# Patient Record
Sex: Male | Born: 1985 | Race: White | Hispanic: No | Marital: Married | Smoking: Never smoker
Health system: Southern US, Community
[De-identification: ages and names within clinical notes are randomized; demographics above are authoritative.]

## PROBLEM LIST (undated history)

## (undated) DIAGNOSIS — E7800 Pure hypercholesterolemia, unspecified: Secondary | ICD-10-CM

## (undated) HISTORY — DX: Pure hypercholesterolemia, unspecified: E78.00

---

## 2017-03-08 ENCOUNTER — Other Ambulatory Visit: Payer: Self-pay | Admitting: Otolaryngology

## 2017-03-08 DIAGNOSIS — R42 Dizziness and giddiness: Secondary | ICD-10-CM

## 2017-03-15 ENCOUNTER — Ambulatory Visit
Admission: RE | Admit: 2017-03-15 | Discharge: 2017-03-15 | Disposition: A | Discharge status: Home / Self Care | Payer: BLUE CROSS/BLUE SHIELD | Source: Ambulatory Visit | Attending: Otolaryngology | Admitting: Otolaryngology

## 2017-03-15 ENCOUNTER — Other Ambulatory Visit: Payer: Self-pay | Admitting: Otolaryngology

## 2017-03-15 DIAGNOSIS — Z1389 Encounter for screening for other disorder: Secondary | ICD-10-CM | POA: Diagnosis not present

## 2017-03-15 DIAGNOSIS — Z0189 Encounter for other specified special examinations: Secondary | ICD-10-CM

## 2017-03-15 DIAGNOSIS — R42 Dizziness and giddiness: Secondary | ICD-10-CM | POA: Diagnosis present

## 2017-03-15 MED ORDER — GADOBENATE DIMEGLUMINE 529 MG/ML IV SOLN
20.0000 mL | Freq: Once | INTRAVENOUS | Status: AC | PRN
  Administered 2017-03-15: 20 mL via INTRAVENOUS

## 2018-02-07 ENCOUNTER — Other Ambulatory Visit: Payer: Self-pay | Admitting: Family Medicine

## 2018-02-07 DIAGNOSIS — E559 Vitamin D deficiency, unspecified: Secondary | ICD-10-CM | POA: Insufficient documentation

## 2018-02-07 DIAGNOSIS — R1011 Right upper quadrant pain: Secondary | ICD-10-CM

## 2018-02-07 DIAGNOSIS — R42 Dizziness and giddiness: Secondary | ICD-10-CM | POA: Insufficient documentation

## 2018-02-07 DIAGNOSIS — R0789 Other chest pain: Secondary | ICD-10-CM | POA: Insufficient documentation

## 2018-02-14 ENCOUNTER — Ambulatory Visit
Admission: RE | Admit: 2018-02-14 | Discharge: 2018-02-14 | Disposition: A | Discharge status: Home / Self Care | Payer: BLUE CROSS/BLUE SHIELD | Source: Ambulatory Visit | Attending: Family Medicine | Admitting: Family Medicine

## 2018-02-14 DIAGNOSIS — K824 Cholesterolosis of gallbladder: Secondary | ICD-10-CM | POA: Diagnosis not present

## 2018-02-14 DIAGNOSIS — R1011 Right upper quadrant pain: Secondary | ICD-10-CM | POA: Diagnosis present

## 2018-03-05 ENCOUNTER — Other Ambulatory Visit: Payer: Self-pay | Admitting: Family Medicine

## 2018-03-05 DIAGNOSIS — R319 Hematuria, unspecified: Secondary | ICD-10-CM

## 2018-03-17 ENCOUNTER — Ambulatory Visit
Admission: RE | Admit: 2018-03-17 | Discharge: 2018-03-17 | Disposition: A | Discharge status: Home / Self Care | Payer: BLUE CROSS/BLUE SHIELD | Source: Ambulatory Visit | Attending: Family Medicine | Admitting: Family Medicine

## 2018-03-17 DIAGNOSIS — R160 Hepatomegaly, not elsewhere classified: Secondary | ICD-10-CM | POA: Diagnosis not present

## 2018-03-17 DIAGNOSIS — R319 Hematuria, unspecified: Secondary | ICD-10-CM | POA: Insufficient documentation

## 2018-03-28 ENCOUNTER — Ambulatory Visit: Payer: BLUE CROSS/BLUE SHIELD

## 2018-06-11 ENCOUNTER — Ambulatory Visit (INDEPENDENT_AMBULATORY_CARE_PROVIDER_SITE_OTHER): Payer: BLUE CROSS/BLUE SHIELD | Admitting: Urology

## 2018-06-11 ENCOUNTER — Encounter: Payer: Self-pay | Admitting: Urology

## 2018-06-11 VITALS — BP 121/68 | HR 80 | Ht 74.0 in | Wt 249.8 lb

## 2018-06-11 DIAGNOSIS — R3129 Other microscopic hematuria: Secondary | ICD-10-CM

## 2018-06-11 LAB — URINALYSIS, COMPLETE
Bilirubin, UA: NEGATIVE
Glucose, UA: NEGATIVE
KETONES UA: NEGATIVE
Leukocytes, UA: NEGATIVE
NITRITE UA: NEGATIVE
Protein, UA: NEGATIVE
Specific Gravity, UA: 1.01 (ref 1.005–1.030)
UUROB: 0.2 mg/dL (ref 0.2–1.0)
pH, UA: 5.5 (ref 5.0–7.5)

## 2018-06-11 NOTE — Progress Notes (Signed)
06/11/2018 4:21 PM   Danny Romero 1986-02-24 [id number]  Referring provider: Wilford Corner, PA-C [address] [place], [place] [address]  Chief Complaint  Patient presents with  . Hematuria    HPI: Patient is a 32 -year-old Caucasian male who presents today as a referral from [employer], PA-C for microscopic hematuria.    Patient was found to have microscopic hematuria on three occassions (February 14, 2018, February 28, 2018 and May 16, 2018)  with 4-10 RBC's/hpf.  Had CDL urine dip with blood seven years ago and it was rechecked and blood was gone.   He does not have a prior history of recurrent urinary tract infections, nephrolithiasis, trauma to the genitourinary tract, BPH or malignancies of the genitourinary tract.   His mother and paternal uncle have a history of nephrolithiasis, malignancies of the genitourinary tract or hematuria.   Today, he is having symptoms of frequency and nocturia.   incontinence, hesitancy, intermittency, straining to urinate or a weak urinary stream.  Patient denies any gross hematuria, dysuria or suprapubic/flank pain.  Patient denies any fevers, chills, nausea or vomiting.  His UA today is negative.    Non contrast CT in 03/2018 revealed no obstructing renal/ureteral calculi or hydronephrosis.  Slightly prominent vessels upper scrotum. Cannot exclude varicocele.  Enlarged liver spanning over 19.8 cm.  He is not a smoker.  He is not exposed to secondhand smoke.  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   He  has a high BMI.     PMH: Past Medical History:  Diagnosis Date  . High cholesterol     Surgical History: None   Home Medications:  Allergies as of 06/11/2018      Reactions   Erythromycin Other (See Comments)   "eyes roll back in his head"      Medication List        Accurate as of 06/11/18  4:21 PM. Always use your most recent med list.          azelastine 0.1 % nasal spray Commonly known  as:  ASTELIN Place 0.1 sprays into the nose daily.   azelastine 0.1 % nasal spray Commonly known as:  ASTELIN Place 0.1 sprays into both nostrils daily.   fluticasone 50 MCG/ACT nasal spray Commonly known as:  FLONASE Place 50 sprays into the nose daily.   ibuprofen 200 MG tablet Commonly known as:  ADVIL,MOTRIN Take 200 mg by mouth daily.   PROBIOTIC PO Take 1,000 mg by mouth daily.   vitamin B-12 1000 MCG tablet Commonly known as:  CYANOCOBALAMIN Take 1,000 mcg by mouth daily.   VITAMIN D-1000 MAX ST 1000 units tablet Generic drug:  Cholecalciferol Take 5,000 Units by mouth daily.       Allergies:  Allergies  Allergen Reactions  . Erythromycin Other (See Comments)    "eyes roll back in his head"    Family History: No family history on file.  Social History:  reports that he has never smoked. He has never used smokeless tobacco. He reports that he drinks about 1.0 standard drinks of alcohol per week. He reports that he does not use drugs.  ROS: UROLOGY Frequent Urination?: Yes Hard to postpone urination?: No Burning/pain with urination?: No Get up at night to urinate?: Yes Leakage of urine?: No Urine stream starts and stops?: No Trouble starting stream?: No Do you have to strain to urinate?: No Blood in urine?: Yes Urinary tract infection?: No Sexually transmitted  disease?: No Injury to kidneys or bladder?: No Painful intercourse?: No Weak stream?: No Erection problems?: No Penile pain?: No  Gastrointestinal Nausea?: No Vomiting?: No Indigestion/heartburn?: Yes Diarrhea?: No Constipation?: No  Constitutional Fever: No Night sweats?: Yes Weight loss?: No Fatigue?: No  Skin Skin rash/lesions?: No Itching?: No  Eyes Blurred vision?: No Double vision?: No  Ears/Nose/Throat Sore throat?: Yes Sinus problems?: Yes  Hematologic/Lymphatic Swollen glands?: No Easy bruising?: No  Cardiovascular Leg swelling?: No Chest pain?:  No  Respiratory Cough?: Yes Shortness of breath?: No  Endocrine Excessive thirst?: No  Musculoskeletal Back pain?: No Joint pain?: No  Neurological Headaches?: Yes Dizziness?: Yes  Psychologic Depression?: No Anxiety?: No  Physical Exam: BP 121/68 (BP Location: Right Arm, Patient Position: Sitting, Cuff Size: Large)   Pulse 80   Ht 6\' 2"  (1.88 m)   Wt 249 lb 12.8 oz (113.3 kg)   BMI 32.07 kg/m   Constitutional:  Well nourished. Alert and oriented, No acute distress. HEENT: [place] AT, moist mucus membranes.  Trachea midline, no masses. Cardiovascular: No clubbing, cyanosis, or edema. Respiratory: Normal respiratory effort, no increased work of breathing. GI: Abdomen is soft, non tender, non distended, no abdominal masses. Liver and spleen not palpable.  No hernias appreciated.  Stool sample for occult testing is not indicated.   GU: No CVA tenderness.  No bladder fullness or masses.  Patient with circumcised phallus.  Urethral meatus is patent.  No penile discharge. No penile lesions or rashes. Scrotum without cysts, rashes and/or edema.  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  Left varicocele is noted.  testicles are located scrotally bilaterally. No masses are appreciated in the testicles. Left and right epididymis are normal.  Rectal: Patient with  normal sphincter tone. Anus and perineum without scarring or rashes. No rectal masses are appreciated. Prostate is approximately 35 grams, no nodules are appreciated. Seminal vesicles are normal. Skin: No rashes, bruises or suspicious lesions. Lymph: No cervical or inguinal adenopathy. Neurologic: Grossly intact, no focal deficits, moving all 4 extremities. Psychiatric: Normal mood and affect.  Laboratory Data: No results found for: WBC, HGB, HCT, MCV, PLT  No results found for: CREATININE  No results found for: PSA  No results found for: TESTOSTERONE  No results found for: HGBA1C  No results found for:  TSH  No results found for: CHOL, HDL, CHOLHDL, VLDL, LDLCALC  No results found for: AST No results found for: ALT No components found for: ALKALINEPHOPHATASE No components found for: BILIRUBINTOTAL  No results found for: ESTRADIOL   Urinalysis Negative.  See Epic.  I have reviewed the labs.  Pertinent Imaging: CLINICAL DATA:  32 year old male. Dizzy for the past year. Microhematuria. Initial encounter.  EXAM: CT ABDOMEN AND PELVIS WITHOUT CONTRAST  TECHNIQUE: Multidetector CT imaging of the abdomen and pelvis was performed following the standard protocol without IV contrast.  COMPARISON:  02/14/2018 ultrasound right upper quadrant.  FINDINGS: Lower chest: Minimal scarring/atelectasis lung bases. Heart size within normal limits.  Hepatobiliary: Enlarged liver spanning over 19.8 cm. Taking into account limitation by non contrast imaging, no worrisome hepatic lesion. No calcified gallstone.  Pancreas: Taking into account limitation by non contrast imaging, no worrisome pancreatic mass or inflammation.  Spleen: Taking into account limitation by non contrast imaging, no splenic mass or enlargement.  Adrenals/Urinary Tract: No obstructing stone or hydronephrosis. Taking into account limitation by non contrast imaging, no worrisome renal or adrenal mass. Noncontrast filled views the urinary bladder without gross abnormality.  Stomach/Bowel:  No extraluminal bowel inflammatory process. Specifically, no inflammation surrounds the appendix.  Vascular/Lymphatic: No abdominal aortic aneurysm. Scattered normal size lymph nodes.  Prominent vessels upper aspect of the scrotum. Cannot exclude varicocele.  Other: No free air or bowel containing hernia.  Musculoskeletal: Mild degenerative changes throughout the lower thoracic and lumbar spine. No worrisome osseous lesion.  IMPRESSION: 1. No obstructing renal/ureteral calculi or hydronephrosis. 2. Slightly  prominent vessels upper scrotum. Cannot exclude varicocele. 3. Enlarged liver spanning over 19.8 cm.   Electronically Signed   By: Lacy Duverney M.D.   On: 03/17/2018 16:35  I have independently reviewed the films  Assessment & Plan:    1. Microscopic hematuria Explained to the patient that there are a number of causes that can be associated with blood in the urine, such as stones, UTI's, damage to the urinary tract and/or cancer. At this time, I felt that the patient warranted further urologic evaluation.   The AUA guidelines state that a CT urogram is the preferred imaging study to evaluate hematuria. Explained to the patient that the imaging studies performed on 03/17/2018 are not the recommended studies by the AUA for the work-up of blood in the urine.    The imaging studies that were performed lack the detail of excluding some urological tumors. The recommended study is a CT urogram.  This study does require the use of contrast material.    At this time, he/she may choose to forego the appropriate study with the understanding that they are risking a missed diagnosis of a urological cancer and proceed with in office cystoscopy - he has chosen to undergo CTU and cystoscopy Following the imaging study,  I've recommended a cystoscopy. I described how this is performed, typically in an office setting with a flexible cystoscope. We described the risks, benefits, and possible side effects, the most common of which is a minor amount of blood in the urine and/or burning which usually resolves in 24 to 48 hours.   The patient had the opportunity to ask questions which were answered. Based upon this discussion, the patient is willing to proceed. Therefore, I've ordered: a CT Urogram and cystoscopy.   - The patient will return following all of the above for discussion of the results.   - UA   - Urine culture   - BUN + creatinine      Return for CT Urogram report and cystoscopy.  These notes  generated with voice recognition software. I apologize for typographical errors.  Michiel Cowboy, PA-C  [facility] [address]  [place], [place] [address] [phone]

## 2018-06-12 LAB — BUN+CREAT
BUN/Creatinine Ratio: 15 (ref 9–20)
BUN: 14 mg/dL (ref 6–20)
Creatinine, Ser: 0.96 mg/dL (ref 0.76–1.27)
GFR calc non Af Amer: 103 mL/min/{1.73_m2} (ref >59)
GFR, EST AFRICAN AMERICAN: 120 mL/min/{1.73_m2} (ref >59)

## 2018-06-14 LAB — CULTURE, URINE COMPREHENSIVE

## 2018-06-27 ENCOUNTER — Ambulatory Visit
Admission: RE | Admit: 2018-06-27 | Discharge: 2018-06-27 | Disposition: A | Discharge status: Home / Self Care | Payer: BLUE CROSS/BLUE SHIELD | Source: Ambulatory Visit | Attending: Urology | Admitting: Urology

## 2018-06-27 DIAGNOSIS — R3129 Other microscopic hematuria: Secondary | ICD-10-CM | POA: Insufficient documentation

## 2018-06-27 MED ORDER — IOPAMIDOL (ISOVUE-300) INJECTION 61%
150.0000 mL | Freq: Once | INTRAVENOUS | Status: AC | PRN
  Administered 2018-06-27: 150 mL via INTRAVENOUS

## 2018-07-18 ENCOUNTER — Encounter: Payer: Self-pay | Admitting: Urology

## 2018-07-18 ENCOUNTER — Ambulatory Visit (INDEPENDENT_AMBULATORY_CARE_PROVIDER_SITE_OTHER): Payer: BLUE CROSS/BLUE SHIELD | Admitting: Urology

## 2018-07-18 VITALS — BP 135/84 | HR 80 | Ht 74.0 in | Wt 256.7 lb

## 2018-07-18 DIAGNOSIS — R3129 Other microscopic hematuria: Secondary | ICD-10-CM | POA: Diagnosis not present

## 2018-07-18 LAB — URINALYSIS, COMPLETE
BILIRUBIN UA: NEGATIVE
GLUCOSE, UA: NEGATIVE
Ketones, UA: NEGATIVE
Leukocytes, UA: NEGATIVE
NITRITE UA: NEGATIVE
PROTEIN UA: NEGATIVE
Specific Gravity, UA: 1.015 (ref 1.005–1.030)
UUROB: 0.2 mg/dL (ref 0.2–1.0)
pH, UA: 7 (ref 5.0–7.5)

## 2018-07-18 LAB — MICROSCOPIC EXAMINATION
Epithelial Cells (non renal): NONE SEEN /hpf (ref 0–10)
WBC UA: NONE SEEN /HPF (ref 0–5)

## 2018-07-18 MED ORDER — LIDOCAINE HCL URETHRAL/MUCOSAL 2 % EX GEL
1.0000 "application " | Freq: Once | CUTANEOUS | Status: AC
  Administered 2018-07-18: 1 via URETHRAL

## 2018-07-18 NOTE — Progress Notes (Addendum)
   07/18/2018  CC: No chief complaint on file.   HPI: Danny Romero is a 32 yo M with a personal history of microscopic hematuria returns today for a cystoscopy and CTU report. His last visit with us was on 06/11/2018 with Danny CowboyShannon McGowan PA-C.   There were no vitals taken for this visit. NED. A&Ox3.   No respiratory distress   Abd soft, NT, ND Normal phallus with bilateral descended testicles  Cystoscopy Procedure Note  Patient identification was confirmed, informed consent was obtained, and patient was prepped using Betadine solution.  Lidocaine jelly was administered per urethral meatus.     Pre-Procedure: - Inspection reveals a normal caliber ureteral meatus.  Procedure: The flexible cystoscope was introduced without difficulty - No urethral strictures/lesions are present. - Normal prostate non-inclusive  - Normal bladder neck - Bilateral ureteral orifices identified - Bladder mucosa  reveals no ulcers, tumors, or lesions - No bladder stones - No trabeculation  Retroflexion shows no abnormalities.  Post-Procedure: - Patient tolerated the procedure well  Assessment/ Plan:  1. Microscopic Hematuria  -CT urogram and cystoscopy are negative  -UA is negative -Return in about 6 months (around 01/17/2019) [facility]    Danny AltesScott C Damean Poffenberger, MD  I, Danny Romero, am acting as a scribe for Dr. Lorin PicketScott C. [name],  I, Danny AltesScott C Israel Werts, MD, have reviewed all documentation for this visit. The documentation on 07/18/2018 for the exam, diagnosis, procedures, and orders are all accurate and complete.

## 2019-08-20 ENCOUNTER — Telehealth: Payer: Self-pay | Admitting: Urology

## 2019-08-20 NOTE — Telephone Encounter (Signed)
Appt made for mid March, pt confirmed.

## 2019-08-20 NOTE — Telephone Encounter (Signed)
Would you call Mr. Ingwersen and have him schedule an appointment for recheck on hematuria?

## 2019-10-23 ENCOUNTER — Ambulatory Visit (INDEPENDENT_AMBULATORY_CARE_PROVIDER_SITE_OTHER): Payer: BLUE CROSS/BLUE SHIELD | Admitting: Urology

## 2019-10-23 ENCOUNTER — Encounter: Payer: Self-pay | Admitting: Urology

## 2019-10-23 ENCOUNTER — Other Ambulatory Visit: Payer: Self-pay

## 2019-10-23 VITALS — BP 138/81 | HR 79 | Ht 74.0 in | Wt 277.0 lb

## 2019-10-23 DIAGNOSIS — R3129 Other microscopic hematuria: Secondary | ICD-10-CM | POA: Diagnosis not present

## 2019-10-23 LAB — URINALYSIS, COMPLETE
Bilirubin, UA: NEGATIVE
Glucose, UA: NEGATIVE
Ketones, UA: NEGATIVE
Leukocytes,UA: NEGATIVE
Nitrite, UA: NEGATIVE
Protein,UA: NEGATIVE
Specific Gravity, UA: 1.025 (ref 1.005–1.030)
Urobilinogen, Ur: 0.2 mg/dL (ref 0.2–1.0)
pH, UA: 6.5 (ref 5.0–7.5)

## 2019-10-23 LAB — MICROSCOPIC EXAMINATION
Bacteria, UA: NONE SEEN
Epithelial Cells (non renal): NONE SEEN /hpf (ref 0–10)

## 2019-10-23 NOTE — Progress Notes (Signed)
10/23/2019 11:58 AM   Danny Romero Nov 03, 1985 [id number]  Referring provider: Donnamarie Rossetti, PA-C [place],  [place] [address]  Chief Complaint  Patient presents with  . Hematuria    HPI: Patient is a 34 -year-old male with persistent microscopic hematuria who presents today for follow-up.  Non contrast CT in 03/2018 revealed no obstructing renal/ureteral calculi or hydronephrosis.  Slightly prominent vessels upper scrotum. Cannot exclude varicocele.  Enlarged liver spanning over 19.8 cm.  CT Urogram 06/27/2018 Negative. No radiographic evidence of urinary tract neoplasm, urolithiasis, or other significant abnormality.   Cysto 07/18/2018 with Dr. Bernardo Heater NED.  Today, he has no complaints.  Patient denies any modifying or aggravating factors.  Patient denies any gross hematuria, dysuria or suprapubic/flank pain.  Patient denies any fevers, chills, nausea or vomiting.   His UA yellow clear, specific gravity 1.025, 2+ blood, pH 6.5, 0-5 WBC's and 3-10 RBC's.   PMH: Past Medical History:  Diagnosis Date  . High cholesterol     Surgical History: None   Home Medications:  Allergies as of 10/23/2019      Reactions   Erythromycin Other (See Comments)   "eyes roll back in his head"      Medication List       Accurate as of October 23, 2019 11:59 PM. If you have any questions, ask your nurse or doctor.        STOP taking these medications   azelastine 0.1 % nasal spray Commonly known as: ASTELIN Stopped by: Yisrael Obryan, PA-C   fluticasone 50 MCG/ACT nasal spray Commonly known as: FLONASE Stopped by: Zara Council, PA-C     TAKE these medications   ibuprofen 200 MG tablet Commonly known as: ADVIL Take 200 mg by mouth daily.   loratadine 10 MG tablet Commonly known as: CLARITIN Take 10 mg by mouth daily.   pantoprazole 40 MG tablet Commonly known as: PROTONIX Take by mouth.   PROBIOTIC PO Take 1,000 mg by mouth daily.     vitamin B-12 1000 MCG tablet Commonly known as: CYANOCOBALAMIN Take 1,000 mcg by mouth daily.   Vitamin D-1000 Max St 25 MCG (1000 UT) tablet Generic drug: Cholecalciferol Take 5,000 Units by mouth daily.       Allergies:  Allergies  Allergen Reactions  . Erythromycin Other (See Comments)    "eyes roll back in his head"    Family History: History reviewed. No pertinent family history.  Social History:  reports that he has never smoked. He has never used smokeless tobacco. He reports current alcohol use of about 1.0 standard drinks of alcohol per week. He reports that he does not use drugs.  ROS: For pertinent review of systems please refer to history of present illness  Physical Exam: BP 138/81   Pulse 79   Ht 6\' 2"  (1.88 m)   Wt 277 lb (125.6 kg)   BMI 35.56 kg/m   Constitutional:  Well nourished. Alert and oriented, No acute distress. HEENT: [place] AT, mask in place.  Trachea midline, no masses. Cardiovascular: No clubbing, cyanosis, or edema. Respiratory: Normal respiratory effort, no increased work of breathing. Neurologic: Grossly intact, no focal deficits, moving all 4 extremities. Psychiatric: Normal mood and affect.  Laboratory Data: Lab Results  Component Value Date   CREATININE 0.96 06/11/2018   Urinalysis Component     Latest Ref Rng & Units 10/23/2019  Specific Gravity, UA     1.005 - 1.030 1.025  pH, UA  5.0 - 7.5 6.5  Color, UA     Yellow Yellow  Appearance Ur     Clear Clear  Leukocytes,UA     Negative Negative  Protein,UA     Negative/Trace Negative  Glucose, UA     Negative Negative  Ketones, UA     Negative Negative  RBC, UA     Negative 2+ (A)  Bilirubin, UA     Negative Negative  Urobilinogen, Ur     0.2 - 1.0 mg/dL 0.2  Nitrite, UA     Negative Negative  Microscopic Examination      See below:   Component     Latest Ref Rng & Units 10/23/2019  WBC, UA     0 - 5 /hpf 0-5  RBC     0 - 2 /hpf 3-10 (A)  Epithelial Cells  (non renal)     0 - 10 /hpf None seen  Bacteria, UA     None seen/Few None seen    Pertinent Imaging: No recent imaging  Assessment & Plan:    1. Microscopic hematuria 3-10 RBC's on today's UA  Negative CT urogram and cystoscopy in 2019 with low risk factors Refer to nephrology for further evaluation.  He did ask if this was a necessary referral, but I explained that we did have some individuals who continued to have microscopic blood in their urine and that is there baseline.  I did encourage him to see nephrology to rule out any renal diseases.  I stated that some renal diseases left untreated would result in the need for dialysis in the future.   Return for refer to nephrology .  These notes generated with voice recognition software. I apologize for typographical errors.  Michiel Cowboy, PA-C  [facility] [address]  [place], [place] [address] [phone]

## 2019-10-28 ENCOUNTER — Telehealth: Payer: Self-pay | Admitting: Urology

## 2019-10-28 NOTE — Telephone Encounter (Signed)
Pt called back and I told him the previous message. Pt. York Spaniel he would call back as soon as he finds out who is in network for him so we can send the referral.

## 2019-10-28 NOTE — Telephone Encounter (Signed)
LM FOR PT TO CB  WE CAN'T REFER HIM TO [facility] BECAUSE BOTH OF HIS INSURANCE'S ARE OUT OF NETWORK FOR [place] AND THEM. HE WILL NEED TO FIND A PROVIDER THAT IS IN IS NETWORK.   MICHELLE

## 2019-12-05 ENCOUNTER — Ambulatory Visit: Payer: Self-pay | Attending: Internal Medicine

## 2019-12-05 DIAGNOSIS — Z23 Encounter for immunization: Secondary | ICD-10-CM

## 2019-12-05 NOTE — Progress Notes (Signed)
   Covid-19 Vaccination Clinic  Name:  Danny Romero    MRN: [id number] DOB: 08/08/1986  12/05/2019  Danny Romero was observed post Covid-19 immunization for 30 minutes based on pre-vaccination screening without incident. He was provided with Vaccine Information Sheet and instruction to access the V-Safe system.   Danny Romero was instructed to call 911 with any severe reactions post vaccine: Marland Kitchen Difficulty breathing  . Swelling of face and throat  . A fast heartbeat  . A bad rash all over body  . Dizziness and weakness   Immunizations Administered    Name Date Dose VIS Date Route   Pfizer COVID-19 Vaccine 12/05/2019  1:20 PM 0.3 mL 10/07/2018 Intramuscular   Manufacturer: [employer], [employer]   Lot: [id number]   NDC: [address]-3

## 2019-12-25 ENCOUNTER — Telehealth: Payer: Self-pay | Admitting: Urology

## 2019-12-25 NOTE — Telephone Encounter (Signed)
Please call Danny Romero and let him know that the nephrologist office was unable to reach him to schedule his appointment.  It is important that he sees nephrology for further evaluation of microscopic blood in his urine.   We want to make sure his kidneys are healthy and prevent any kidney failure in the future.

## 2019-12-26 ENCOUNTER — Ambulatory Visit: Payer: Self-pay | Attending: Internal Medicine

## 2019-12-26 ENCOUNTER — Other Ambulatory Visit: Payer: Self-pay

## 2019-12-26 DIAGNOSIS — Z23 Encounter for immunization: Secondary | ICD-10-CM

## 2019-12-26 NOTE — Progress Notes (Signed)
   Covid-19 Vaccination Clinic  Name:  Danny Romero    MRN: [id number] DOB: Mar 09, 1986  12/26/2019  Danny Romero was observed post Covid-19 immunization for 15 minutes without incident. He was provided with Vaccine Information Sheet and instruction to access the V-Safe system.   Danny Romero was instructed to call 911 with any severe reactions post vaccine: Marland Kitchen Difficulty breathing  . Swelling of face and throat  . A fast heartbeat  . A bad rash all over body  . Dizziness and weakness   Immunizations Administered    Name Date Dose VIS Date Route   Pfizer COVID-19 Vaccine 12/26/2019 10:16 AM 0.3 mL 10/07/2018 Intramuscular   Manufacturer: [employer], [employer]   Lot: [id number]   NDC: [address]-3

## 2019-12-28 ENCOUNTER — Encounter: Payer: Self-pay | Admitting: Radiology

## 2019-12-28 NOTE — Telephone Encounter (Signed)
Patient notified

## 2020-03-18 IMAGING — CT CT ABD-PEL WO/W CM
2 of 8 series · 12 of 46 positions shown, 18 images · IV contrast (iopamidol)
Comparison: Noncontrast CT on 03/17/2018

CLINICAL DATA: Microscopic hematuria. Urinary frequency. Nocturia.
Chronic right upper quadrant pain and nausea.

EXAM:
CT ABDOMEN AND PELVIS WITHOUT AND WITH CONTRAST
TECHNIQUE: Multidetector CT imaging of the abdomen and pelvis was performed
following the standard protocol before and following the bolus
administration of intravenous contrast.
CONTRAST:  150mL [id number] IOPAMIDOL ([id number]) INJECTION 61%

[Series 6: cor without without pre · coronal · non-contrast · 0.77mm/px · 3 of 166 slices shown]
[im 42/166  soft-tissue]
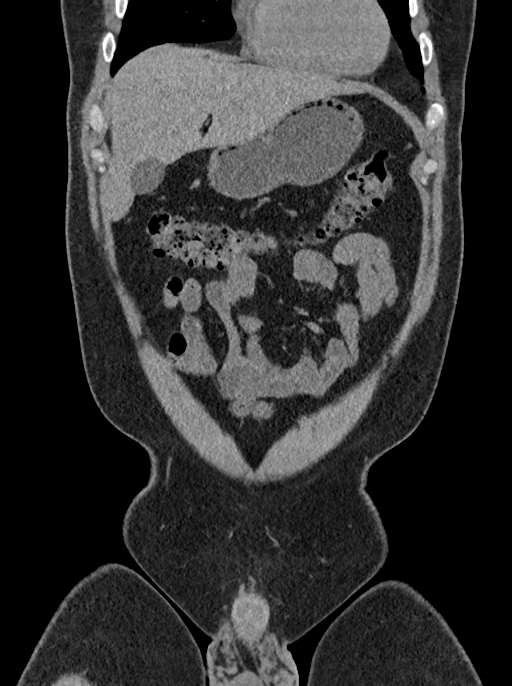
[im 83/166  soft-tissue]
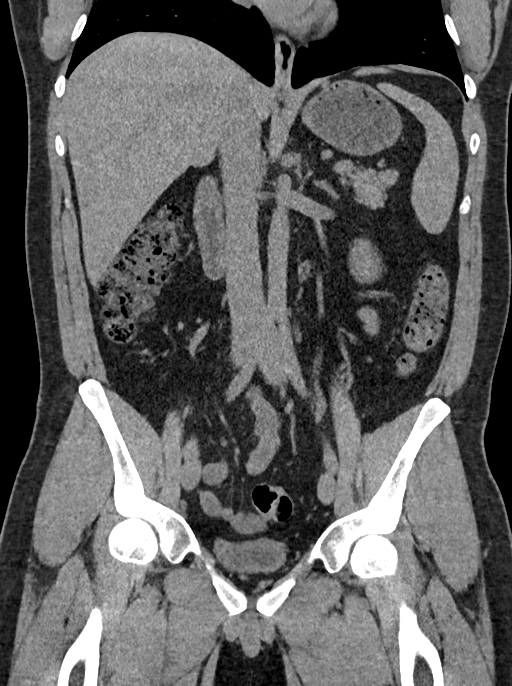
[im 124/166  soft-tissue]
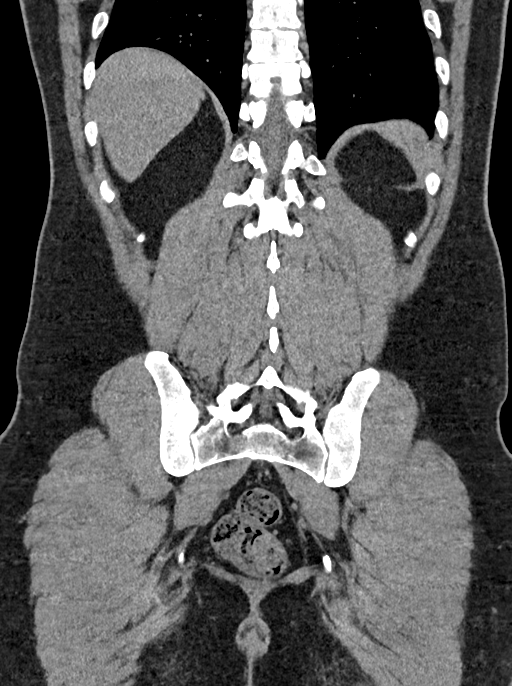

[Series 10: axial delay prone · axial · delayed · 0.77mm/px · z∈[-1632,-1182]mm · 9 of 110 slices shown, 15 images]
[im 10/110  soft-tissue]
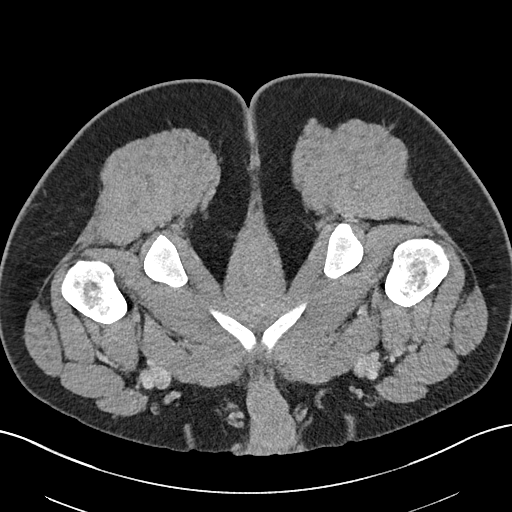
[im 10/110  bone]
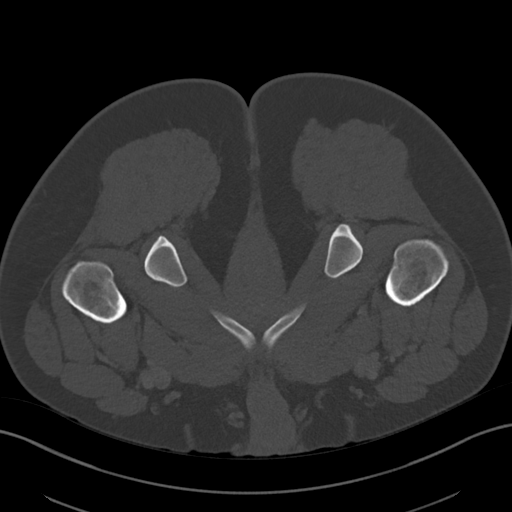
[im 20/110  soft-tissue]
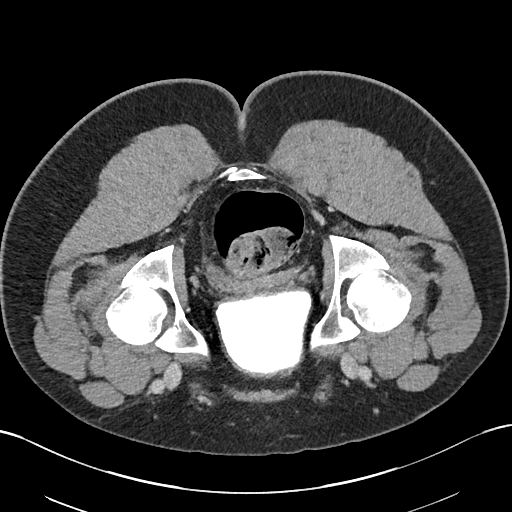
[im 30/110  soft-tissue]
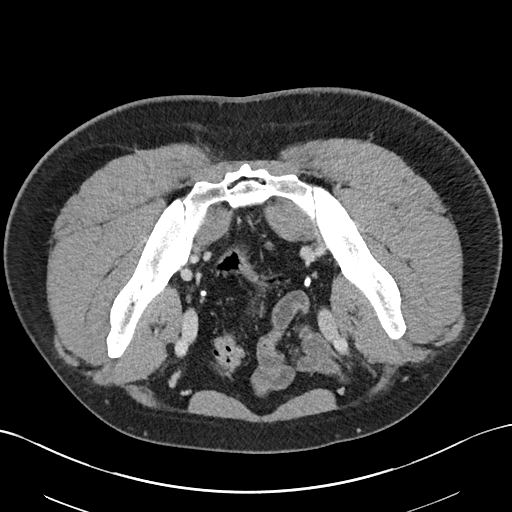
[im 40/110  soft-tissue]
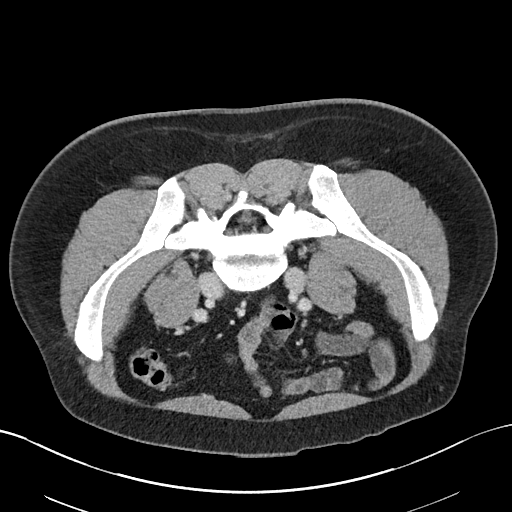
[im 60/110  soft-tissue]
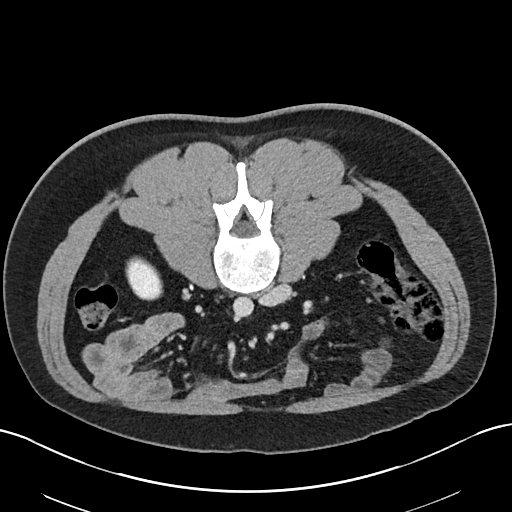
[im 70/110  soft-tissue]
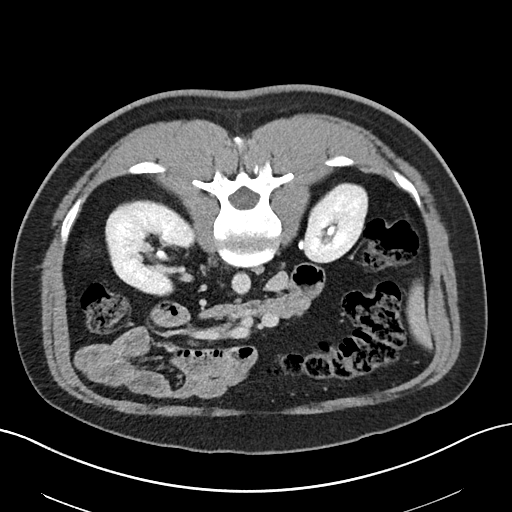
[im 70/110  lung]
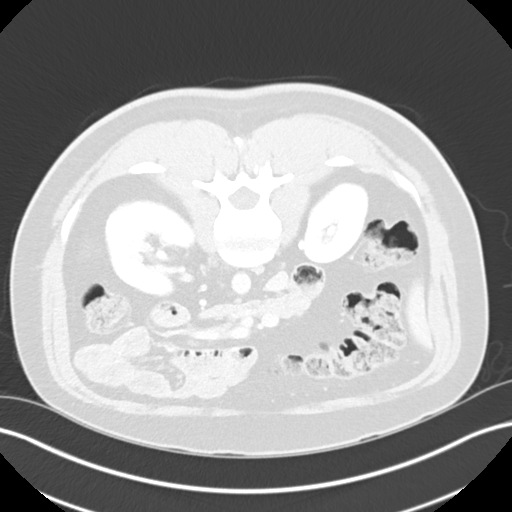
[im 80/110  soft-tissue]
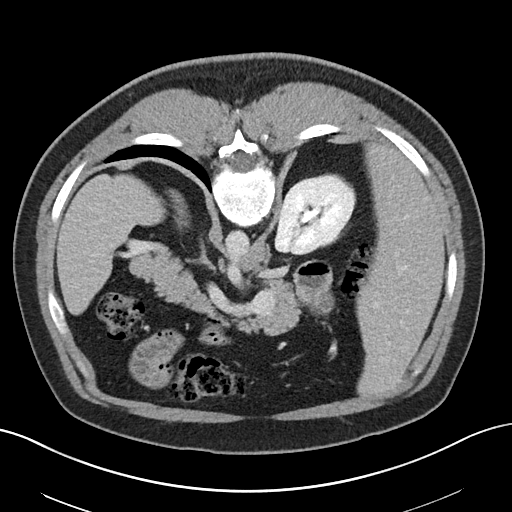
[im 80/110  lung]
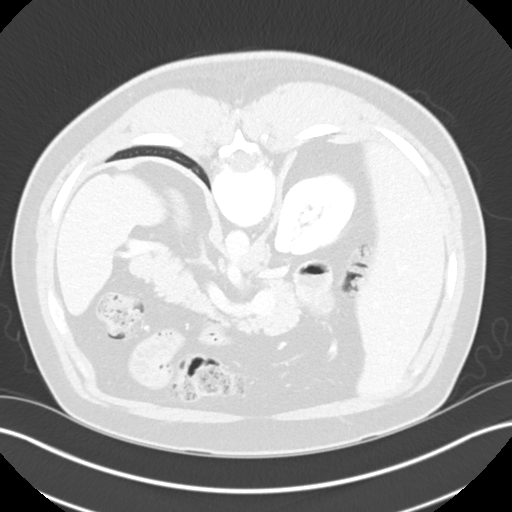
[im 90/110  soft-tissue]
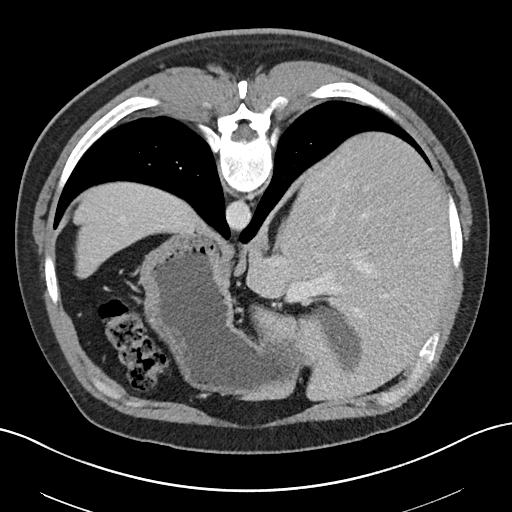
[im 90/110  lung]
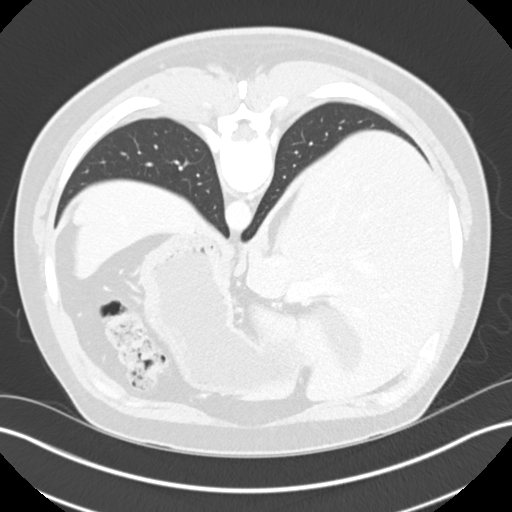
[im 100/110  soft-tissue]
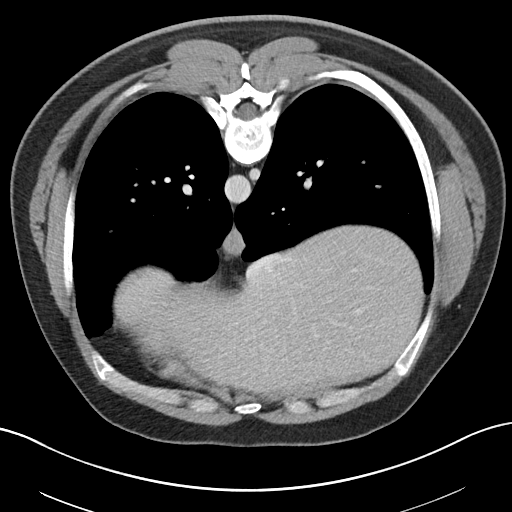
[im 100/110  lung]
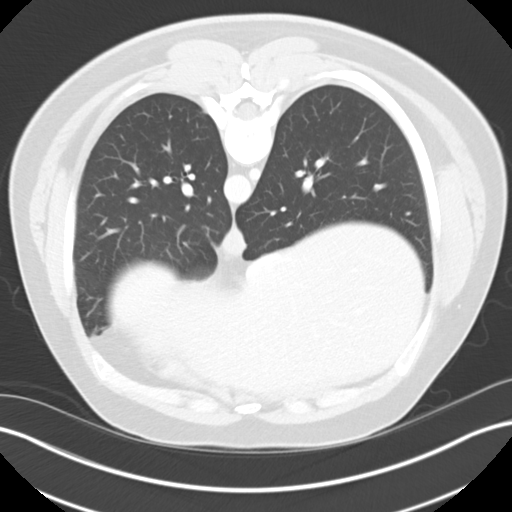
[im 100/110  bone]
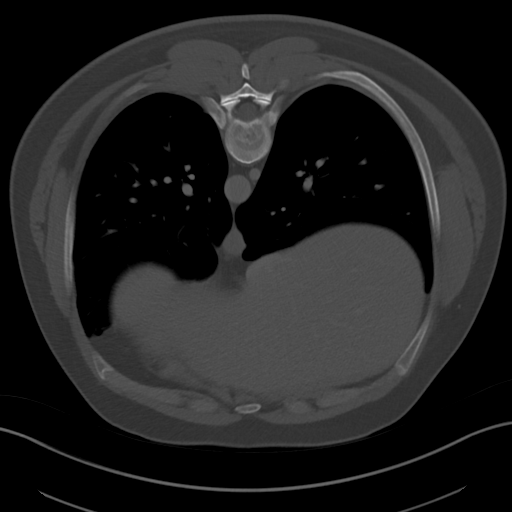

[12 of 46 positions shown; findings below may reference images not displayed]

FINDINGS: Lower Chest: No acute findings.

Hepatobiliary: No hepatic masses identified. Gallbladder is
unremarkable.

Pancreas:  No mass or inflammatory changes.

Spleen: Within normal limits in size and appearance.

Adrenals/Urinary Tract: No adrenal masses identified. No evidence of
urolithiasis or hydronephrosis. No complex cystic or solid renal
masses identified. No masses seen involving the ureters or bladder.

Stomach/Bowel: No evidence of obstruction, inflammatory process or
abnormal fluid collections. Normal appendix visualized.

Vascular/Lymphatic: No pathologically enlarged lymph nodes. No
abdominal aortic aneurysm.

Reproductive:  No mass or other significant abnormality.

Other:  None.

Musculoskeletal:  No suspicious bone lesions identified.
IMPRESSION: Negative. No radiographic evidence of urinary tract neoplasm,
urolithiasis, or other significant abnormality.

## 2020-08-23 ENCOUNTER — Other Ambulatory Visit: Payer: BLUE CROSS/BLUE SHIELD

## 2020-08-23 ENCOUNTER — Other Ambulatory Visit: Payer: Self-pay

## 2020-08-23 DIAGNOSIS — Z20822 Contact with and (suspected) exposure to covid-19: Secondary | ICD-10-CM

## 2020-08-24 LAB — NOVEL CORONAVIRUS, NAA: SARS-CoV-2, NAA: NOT DETECTED

## 2020-08-24 LAB — SARS-COV-2, NAA 2 DAY TAT

## 2021-08-18 ENCOUNTER — Ambulatory Visit: Admit: 2021-08-18 | Discharge status: Absent without leave | Payer: BLUE CROSS/BLUE SHIELD

## 2022-04-03 ENCOUNTER — Ambulatory Visit
Admission: RE | Admit: 2022-04-03 | Discharge: 2022-04-03 | Disposition: A | Discharge status: Home / Self Care | Payer: BLUE CROSS/BLUE SHIELD | Source: Ambulatory Visit | Attending: Emergency Medicine | Admitting: Emergency Medicine

## 2022-04-03 VITALS — BP 131/83 | HR 84 | Temp 97.9°F | Resp 18 | Ht 74.0 in

## 2022-04-03 DIAGNOSIS — J0100 Acute maxillary sinusitis, unspecified: Secondary | ICD-10-CM | POA: Diagnosis not present

## 2022-04-03 MED ORDER — AMOXICILLIN 875 MG PO TABS: 875.0000 mg | ORAL_TABLET | Freq: Two times a day (BID) | ORAL | 0 refills | Status: AC

## 2022-04-03 NOTE — Discharge Instructions (Addendum)
Take the amoxicillin as directed.  Follow up with your primary care provider if your symptoms are not improving.   ° ° °

## 2022-04-03 NOTE — ED Triage Notes (Signed)
Patient to Urgent Care with complaints of left sided ear ache and fullness. Also reports some sinus congestion.   Symptoms started approx 2 weeks ago, states that he had covid and it initially improved but has now worsened.

## 2022-04-03 NOTE — ED Provider Notes (Signed)
Danny Romero    CSN: [id number] Arrival date & time: 04/03/22  1748      History   Chief Complaint Chief Complaint  Patient presents with   Ear Fullness    Ears itchy and hurt - Entered by patient    HPI Danny Romero is a 36 y.o. male.  Patient presents with congestion, postnasal drip, sinus pressure, ear pain.  He tested positive for COVID at home 2 to 3 weeks ago.  His symptoms were improving but then got worse again 3 to 4 days ago.  He denies fever, rash, sore throat, cough, shortness of breath, vomiting, diarrhea, or other symptoms.  Treatment at home with Sudafed.  The history is provided by the patient and medical records.    Past Medical History:  Diagnosis Date   High cholesterol     Patient Active Problem List   Diagnosis Date Noted   Chest pain, atypical 02/07/2018   Dizziness 02/07/2018   Vitamin D deficiency 02/07/2018    History reviewed. No pertinent surgical history.     Home Medications    Prior to Admission medications   Medication Sig Start Date End Date Taking? Authorizing Provider  amoxicillin (AMOXIL) 875 MG tablet Take 1 tablet (875 mg total) by mouth 2 (two) times daily for 10 days. 04/03/22 04/13/22 Yes Mickie Bail, NP  Cholecalciferol (VITAMIN D-1000 MAX ST) 1000 units tablet Take 5,000 Units by mouth daily.    [provider]  ibuprofen (ADVIL,MOTRIN) 200 MG tablet Take 200 mg by mouth daily.    [provider]  loratadine (CLARITIN) 10 MG tablet Take 10 mg by mouth daily.    [provider]  pantoprazole (PROTONIX) 40 MG tablet Take by mouth. 06/27/18   [provider]  Probiotic Product (PROBIOTIC PO) Take 1,000 mg by mouth daily.     [provider]  vitamin B-12 (CYANOCOBALAMIN) 1000 MCG tablet Take 1,000 mcg by mouth daily.    [provider]    Family History History reviewed. No pertinent family history.  Social History Social History   Tobacco Use   Smoking  status: Never   Smokeless tobacco: Never  Substance Use Topics   Alcohol use: Yes    Alcohol/week: 1.0 standard drink of alcohol    Types: 1 Shots of liquor per week    Comment: once a month may take a drink   Drug use: Never     Allergies   Erythromycin   Review of Systems Review of Systems  Constitutional:  Negative for chills and fever.  HENT:  Positive for congestion, ear pain, postnasal drip and sinus pressure. Negative for ear discharge and sore throat.   Respiratory:  Negative for cough and shortness of breath.   Cardiovascular:  Negative for chest pain and palpitations.  Gastrointestinal:  Negative for diarrhea and vomiting.  Skin:  Negative for rash.  All other systems reviewed and are negative.    Physical Exam Triage Vital Signs ED Triage Vitals  Enc Vitals Group     BP 04/03/22 1810 131/83     Pulse Rate 04/03/22 1810 84     Resp 04/03/22 1810 18     Temp 04/03/22 1810 97.9 F (36.6 C)     Temp src --      SpO2 04/03/22 1810 97 %     Weight --      Height 04/03/22 1821 6\' 2"  (1.88 m)     Head Circumference --  Peak Flow --      Pain Score 04/03/22 1820 4     Pain Loc --      Pain Edu? --      Excl. in GC? --    No data found.  Updated Vital Signs BP 131/83   Pulse 84   Temp 97.9 F (36.6 C)   Resp 18   Ht 6\' 2"  (1.88 m)   SpO2 97%   BMI 35.56 kg/m   Visual Acuity Right Eye Distance:   Left Eye Distance:   Bilateral Distance:    Right Eye Near:   Left Eye Near:    Bilateral Near:     Physical Exam Vitals and nursing note reviewed.  Constitutional:      General: He is not in acute distress.    Appearance: Normal appearance. He is well-developed. He is not ill-appearing.  HENT:     Right Ear: Tympanic membrane and ear canal normal.     Left Ear: Tympanic membrane and ear canal normal.     Nose: Congestion present.     Mouth/Throat:     Mouth: Mucous membranes are moist.     Pharynx: Oropharynx is clear.  Cardiovascular:      Rate and Rhythm: Normal rate and regular rhythm.     Heart sounds: Normal heart sounds.  Pulmonary:     Effort: Pulmonary effort is normal. No respiratory distress.     Breath sounds: Normal breath sounds.  Musculoskeletal:     Cervical back: Neck supple.  Skin:    General: Skin is warm and dry.  Neurological:     Mental Status: He is alert.  Psychiatric:        Mood and Affect: Mood normal.        Behavior: Behavior normal.      UC Treatments / Results  Labs (all labs ordered are listed, but only abnormal results are displayed) Labs Reviewed - No data to display  EKG   Radiology No results found.  Procedures Procedures (including critical care time)  Medications Ordered in UC Medications - No data to display  Initial Impression / Assessment and Plan / UC Course  I have reviewed the triage vital signs and the nursing notes.  Pertinent labs & imaging results that were available during my care of the patient were reviewed by me and considered in my medical decision making (see chart for details).    Acute sinusitis.  Treating with amoxicillin.  Instructed patient to take ibuprofen and plain Mucinex also.  Instructed him to follow-up with his PCP if his symptoms are not improving.  Education provided on sinusitis.  Patient agrees to plan of care.  Final Clinical Impressions(s) / UC Diagnoses   Final diagnoses:  Acute non-recurrent maxillary sinusitis     Discharge Instructions      Take the amoxicillin as directed.  Follow up with your primary care provider if your symptoms are not improving.        ED Prescriptions     Medication Sig Dispense Auth. Provider   amoxicillin (AMOXIL) 875 MG tablet Take 1 tablet (875 mg total) by mouth 2 (two) times daily for 10 days. 20 tablet , NP      PDMP not reviewed this encounter.   Mickie Bail, NP 04/03/22 [phone]

## 2024-04-23 ENCOUNTER — Ambulatory Visit: Payer: BLUE CROSS/BLUE SHIELD | Admitting: Dermatology

## 2024-05-12 ENCOUNTER — Ambulatory Visit: Admitting: Dermatology
